# Patient Record
Sex: Male | Born: 1960 | Race: White | Hispanic: No | Marital: Married | Smoking: Never smoker
Health system: Southern US, Community
[De-identification: ages and names within clinical notes are randomized; demographics above are authoritative.]

---

## 2012-07-18 ENCOUNTER — Ambulatory Visit
Admission: RE | Admit: 2012-07-18 | Discharge: 2012-07-18 | Disposition: A | Discharge status: Home / Self Care | Payer: 59 | Source: Ambulatory Visit | Attending: Family Medicine | Admitting: Family Medicine

## 2012-07-18 ENCOUNTER — Other Ambulatory Visit: Payer: Self-pay | Admitting: Family Medicine

## 2012-07-18 DIAGNOSIS — M79605 Pain in left leg: Secondary | ICD-10-CM

## 2016-12-13 ENCOUNTER — Other Ambulatory Visit: Payer: Self-pay | Admitting: Orthopedic Surgery

## 2016-12-13 DIAGNOSIS — G8929 Other chronic pain: Secondary | ICD-10-CM

## 2016-12-13 DIAGNOSIS — M545 Low back pain: Principal | ICD-10-CM

## 2016-12-20 ENCOUNTER — Ambulatory Visit
Admission: RE | Admit: 2016-12-20 | Discharge: 2016-12-20 | Disposition: A | Discharge status: Home / Self Care | Payer: 59 | Source: Ambulatory Visit | Attending: Orthopedic Surgery | Admitting: Orthopedic Surgery

## 2016-12-20 DIAGNOSIS — M545 Low back pain: Principal | ICD-10-CM

## 2016-12-20 DIAGNOSIS — G8929 Other chronic pain: Secondary | ICD-10-CM

## 2016-12-20 MED ORDER — METHYLPREDNISOLONE ACETATE 40 MG/ML INJ SUSP (RADIOLOG
120.0000 mg | Freq: Once | INTRAMUSCULAR | Status: AC
  Administered 2016-12-20: 120 mg via EPIDURAL

## 2016-12-20 MED ORDER — IOPAMIDOL (ISOVUE-M 200) INJECTION 41%
1.0000 mL | Freq: Once | INTRAMUSCULAR | Status: AC
  Administered 2016-12-20: 1 mL via EPIDURAL

## 2016-12-20 NOTE — Discharge Instructions (Signed)

## 2017-01-02 ENCOUNTER — Other Ambulatory Visit: Payer: Self-pay | Admitting: Orthopedic Surgery

## 2017-01-02 DIAGNOSIS — M545 Low back pain: Principal | ICD-10-CM

## 2017-01-02 DIAGNOSIS — G8929 Other chronic pain: Secondary | ICD-10-CM

## 2017-01-11 ENCOUNTER — Other Ambulatory Visit: Payer: Self-pay | Admitting: Orthopedic Surgery

## 2017-01-11 ENCOUNTER — Ambulatory Visit
Admission: RE | Admit: 2017-01-11 | Discharge: 2017-01-11 | Disposition: A | Discharge status: Home / Self Care | Payer: 59 | Source: Ambulatory Visit | Attending: Orthopedic Surgery | Admitting: Orthopedic Surgery

## 2017-01-11 DIAGNOSIS — M545 Low back pain: Principal | ICD-10-CM

## 2017-01-11 DIAGNOSIS — G8929 Other chronic pain: Secondary | ICD-10-CM

## 2017-01-11 MED ORDER — IOPAMIDOL (ISOVUE-M 200) INJECTION 41%
1.0000 mL | Freq: Once | INTRAMUSCULAR | Status: AC
  Administered 2017-01-11: 1 mL via EPIDURAL

## 2017-01-11 MED ORDER — METHYLPREDNISOLONE ACETATE 40 MG/ML INJ SUSP (RADIOLOG
120.0000 mg | Freq: Once | INTRAMUSCULAR | Status: AC
  Administered 2017-01-11: 120 mg via EPIDURAL

## 2017-02-13 ENCOUNTER — Other Ambulatory Visit: Payer: Self-pay | Admitting: Orthopedic Surgery

## 2017-02-13 DIAGNOSIS — M545 Low back pain: Principal | ICD-10-CM

## 2017-02-13 DIAGNOSIS — G8929 Other chronic pain: Secondary | ICD-10-CM

## 2017-02-22 ENCOUNTER — Ambulatory Visit
Admission: RE | Admit: 2017-02-22 | Discharge: 2017-02-22 | Disposition: A | Discharge status: Home / Self Care | Payer: 59 | Source: Ambulatory Visit | Attending: Orthopedic Surgery | Admitting: Orthopedic Surgery

## 2017-02-22 ENCOUNTER — Other Ambulatory Visit: Payer: Self-pay | Admitting: Orthopedic Surgery

## 2017-02-22 DIAGNOSIS — G8929 Other chronic pain: Secondary | ICD-10-CM

## 2017-02-22 DIAGNOSIS — M545 Low back pain: Principal | ICD-10-CM

## 2017-02-22 MED ORDER — METHYLPREDNISOLONE ACETATE 40 MG/ML INJ SUSP (RADIOLOG
120.0000 mg | Freq: Once | INTRAMUSCULAR | Status: AC
  Administered 2017-02-22: 120 mg via EPIDURAL

## 2017-02-22 MED ORDER — IOPAMIDOL (ISOVUE-M 200) INJECTION 41%
1.0000 mL | Freq: Once | INTRAMUSCULAR | Status: AC
  Administered 2017-02-22: 1 mL via EPIDURAL

## 2017-02-22 NOTE — Discharge Instructions (Signed)

## 2017-05-05 ENCOUNTER — Other Ambulatory Visit: Payer: Self-pay | Admitting: Sports Medicine

## 2017-05-05 DIAGNOSIS — M545 Low back pain: Principal | ICD-10-CM

## 2017-05-05 DIAGNOSIS — G8929 Other chronic pain: Secondary | ICD-10-CM

## 2017-06-23 ENCOUNTER — Other Ambulatory Visit: Payer: Self-pay | Admitting: Sports Medicine

## 2017-06-23 ENCOUNTER — Ambulatory Visit
Admission: RE | Admit: 2017-06-23 | Discharge: 2017-06-23 | Disposition: A | Discharge status: Home / Self Care | Payer: 59 | Source: Ambulatory Visit | Attending: Sports Medicine | Admitting: Sports Medicine

## 2017-06-23 DIAGNOSIS — M545 Low back pain: Principal | ICD-10-CM

## 2017-06-23 DIAGNOSIS — G8929 Other chronic pain: Secondary | ICD-10-CM

## 2017-06-23 MED ORDER — METHYLPREDNISOLONE ACETATE 40 MG/ML INJ SUSP (RADIOLOG
120.0000 mg | Freq: Once | INTRAMUSCULAR | Status: AC
  Administered 2017-06-23: 120 mg via EPIDURAL

## 2017-06-23 MED ORDER — IOPAMIDOL (ISOVUE-M 200) INJECTION 41%
1.0000 mL | Freq: Once | INTRAMUSCULAR | Status: AC
  Administered 2017-06-23: 1 mL via EPIDURAL

## 2017-06-23 NOTE — Discharge Instructions (Signed)

## 2017-10-23 DIAGNOSIS — R748 Abnormal levels of other serum enzymes: Secondary | ICD-10-CM | POA: Diagnosis not present

## 2017-10-23 DIAGNOSIS — E7800 Pure hypercholesterolemia, unspecified: Secondary | ICD-10-CM | POA: Diagnosis not present

## 2017-11-09 DIAGNOSIS — M48062 Spinal stenosis, lumbar region with neurogenic claudication: Secondary | ICD-10-CM | POA: Diagnosis not present

## 2017-11-09 DIAGNOSIS — M47896 Other spondylosis, lumbar region: Secondary | ICD-10-CM | POA: Diagnosis not present

## 2017-11-14 ENCOUNTER — Other Ambulatory Visit: Payer: Self-pay | Admitting: Orthopedic Surgery

## 2017-11-14 DIAGNOSIS — M47896 Other spondylosis, lumbar region: Secondary | ICD-10-CM

## 2017-11-23 ENCOUNTER — Ambulatory Visit
Admission: RE | Admit: 2017-11-23 | Discharge: 2017-11-23 | Disposition: A | Discharge status: Home / Self Care | Payer: BLUE CROSS/BLUE SHIELD | Source: Ambulatory Visit | Attending: Orthopedic Surgery | Admitting: Orthopedic Surgery

## 2017-11-23 ENCOUNTER — Inpatient Hospital Stay
Admission: RE | Admit: 2017-11-23 | Discharge: 2017-11-23 | Disposition: A | Discharge status: Home / Self Care | Payer: 59 | Source: Ambulatory Visit | Attending: Orthopedic Surgery | Admitting: Orthopedic Surgery

## 2017-11-23 DIAGNOSIS — M5416 Radiculopathy, lumbar region: Secondary | ICD-10-CM | POA: Diagnosis not present

## 2017-11-23 DIAGNOSIS — M47896 Other spondylosis, lumbar region: Secondary | ICD-10-CM

## 2017-11-23 MED ORDER — IOPAMIDOL (ISOVUE-M 200) INJECTION 41%
1.0000 mL | Freq: Once | INTRAMUSCULAR | Status: AC
  Administered 2017-11-23: 1 mL via EPIDURAL

## 2017-11-23 MED ORDER — METHYLPREDNISOLONE ACETATE 40 MG/ML INJ SUSP (RADIOLOG
120.0000 mg | Freq: Once | INTRAMUSCULAR | Status: AC
  Administered 2017-11-23: 120 mg via EPIDURAL

## 2017-12-15 DIAGNOSIS — M4726 Other spondylosis with radiculopathy, lumbar region: Secondary | ICD-10-CM | POA: Diagnosis not present

## 2017-12-15 DIAGNOSIS — M5432 Sciatica, left side: Secondary | ICD-10-CM | POA: Diagnosis not present

## 2018-04-24 DIAGNOSIS — E7800 Pure hypercholesterolemia, unspecified: Secondary | ICD-10-CM | POA: Diagnosis not present

## 2018-04-24 DIAGNOSIS — Z0000 Encounter for general adult medical examination without abnormal findings: Secondary | ICD-10-CM | POA: Diagnosis not present

## 2018-05-01 DIAGNOSIS — Z0000 Encounter for general adult medical examination without abnormal findings: Secondary | ICD-10-CM | POA: Diagnosis not present

## 2018-11-29 DIAGNOSIS — M48062 Spinal stenosis, lumbar region with neurogenic claudication: Secondary | ICD-10-CM | POA: Diagnosis not present

## 2018-11-29 DIAGNOSIS — M5432 Sciatica, left side: Secondary | ICD-10-CM | POA: Diagnosis not present

## 2018-12-13 DIAGNOSIS — L565 Disseminated superficial actinic porokeratosis (DSAP): Secondary | ICD-10-CM | POA: Diagnosis not present

## 2018-12-13 DIAGNOSIS — M79672 Pain in left foot: Secondary | ICD-10-CM | POA: Diagnosis not present

## 2018-12-13 DIAGNOSIS — L602 Onychogryphosis: Secondary | ICD-10-CM | POA: Diagnosis not present

## 2018-12-13 DIAGNOSIS — M79671 Pain in right foot: Secondary | ICD-10-CM | POA: Diagnosis not present

## 2019-05-20 DIAGNOSIS — Z0000 Encounter for general adult medical examination without abnormal findings: Secondary | ICD-10-CM | POA: Diagnosis not present

## 2019-05-20 DIAGNOSIS — Z125 Encounter for screening for malignant neoplasm of prostate: Secondary | ICD-10-CM | POA: Diagnosis not present

## 2019-05-20 DIAGNOSIS — E7800 Pure hypercholesterolemia, unspecified: Secondary | ICD-10-CM | POA: Diagnosis not present

## 2019-05-22 DIAGNOSIS — I10 Essential (primary) hypertension: Secondary | ICD-10-CM | POA: Diagnosis not present

## 2019-05-22 DIAGNOSIS — E559 Vitamin D deficiency, unspecified: Secondary | ICD-10-CM | POA: Diagnosis not present

## 2019-05-22 DIAGNOSIS — Z0000 Encounter for general adult medical examination without abnormal findings: Secondary | ICD-10-CM | POA: Diagnosis not present

## 2019-11-12 DIAGNOSIS — M4726 Other spondylosis with radiculopathy, lumbar region: Secondary | ICD-10-CM | POA: Diagnosis not present

## 2019-11-12 DIAGNOSIS — M5432 Sciatica, left side: Secondary | ICD-10-CM | POA: Diagnosis not present

## 2019-11-12 DIAGNOSIS — M48062 Spinal stenosis, lumbar region with neurogenic claudication: Secondary | ICD-10-CM | POA: Diagnosis not present

## 2020-01-01 DIAGNOSIS — M25512 Pain in left shoulder: Secondary | ICD-10-CM | POA: Diagnosis not present

## 2020-01-16 DIAGNOSIS — M5412 Radiculopathy, cervical region: Secondary | ICD-10-CM | POA: Diagnosis not present

## 2020-01-16 DIAGNOSIS — M4722 Other spondylosis with radiculopathy, cervical region: Secondary | ICD-10-CM | POA: Diagnosis not present

## 2020-01-20 DIAGNOSIS — M542 Cervicalgia: Secondary | ICD-10-CM | POA: Diagnosis not present

## 2020-01-20 DIAGNOSIS — M25512 Pain in left shoulder: Secondary | ICD-10-CM | POA: Diagnosis not present

## 2020-01-20 DIAGNOSIS — R6889 Other general symptoms and signs: Secondary | ICD-10-CM | POA: Diagnosis not present

## 2020-01-28 ENCOUNTER — Other Ambulatory Visit: Payer: Self-pay | Admitting: Orthopedic Surgery

## 2020-01-28 DIAGNOSIS — M5412 Radiculopathy, cervical region: Secondary | ICD-10-CM

## 2020-02-09 ENCOUNTER — Other Ambulatory Visit: Payer: Self-pay

## 2020-02-09 ENCOUNTER — Ambulatory Visit
Admission: RE | Admit: 2020-02-09 | Discharge: 2020-02-09 | Disposition: A | Discharge status: Home / Self Care | Payer: BLUE CROSS/BLUE SHIELD | Source: Ambulatory Visit | Attending: Orthopedic Surgery | Admitting: Orthopedic Surgery

## 2020-02-09 DIAGNOSIS — M5412 Radiculopathy, cervical region: Secondary | ICD-10-CM

## 2020-02-09 DIAGNOSIS — M4802 Spinal stenosis, cervical region: Secondary | ICD-10-CM | POA: Diagnosis not present

## 2020-02-11 DIAGNOSIS — M5412 Radiculopathy, cervical region: Secondary | ICD-10-CM | POA: Diagnosis not present

## 2020-02-11 DIAGNOSIS — M4722 Other spondylosis with radiculopathy, cervical region: Secondary | ICD-10-CM | POA: Diagnosis not present

## 2020-02-28 DIAGNOSIS — M5412 Radiculopathy, cervical region: Secondary | ICD-10-CM | POA: Diagnosis not present

## 2020-02-28 DIAGNOSIS — M4722 Other spondylosis with radiculopathy, cervical region: Secondary | ICD-10-CM | POA: Diagnosis not present

## 2020-05-25 DIAGNOSIS — Z125 Encounter for screening for malignant neoplasm of prostate: Secondary | ICD-10-CM | POA: Diagnosis not present

## 2020-05-25 DIAGNOSIS — E559 Vitamin D deficiency, unspecified: Secondary | ICD-10-CM | POA: Diagnosis not present

## 2020-05-25 DIAGNOSIS — Z23 Encounter for immunization: Secondary | ICD-10-CM | POA: Diagnosis not present

## 2020-05-25 DIAGNOSIS — Z0000 Encounter for general adult medical examination without abnormal findings: Secondary | ICD-10-CM | POA: Diagnosis not present

## 2020-05-25 DIAGNOSIS — Z13220 Encounter for screening for lipoid disorders: Secondary | ICD-10-CM | POA: Diagnosis not present

## 2020-08-26 DIAGNOSIS — Z23 Encounter for immunization: Secondary | ICD-10-CM | POA: Diagnosis not present

## 2020-10-15 DIAGNOSIS — M5116 Intervertebral disc disorders with radiculopathy, lumbar region: Secondary | ICD-10-CM | POA: Diagnosis not present

## 2020-10-15 DIAGNOSIS — M5412 Radiculopathy, cervical region: Secondary | ICD-10-CM | POA: Diagnosis not present

## 2020-10-15 DIAGNOSIS — M545 Low back pain, unspecified: Secondary | ICD-10-CM | POA: Diagnosis not present

## 2020-10-15 DIAGNOSIS — M4726 Other spondylosis with radiculopathy, lumbar region: Secondary | ICD-10-CM | POA: Diagnosis not present

## 2020-11-02 DIAGNOSIS — L02213 Cutaneous abscess of chest wall: Secondary | ICD-10-CM | POA: Diagnosis not present

## 2020-11-10 DIAGNOSIS — L02213 Cutaneous abscess of chest wall: Secondary | ICD-10-CM | POA: Diagnosis not present

## 2020-11-16 ENCOUNTER — Encounter: Payer: Self-pay | Admitting: Dermatology

## 2020-11-16 ENCOUNTER — Ambulatory Visit: Payer: BC Managed Care – PPO | Admitting: Dermatology

## 2020-11-16 ENCOUNTER — Other Ambulatory Visit: Payer: Self-pay

## 2020-11-16 DIAGNOSIS — Z1283 Encounter for screening for malignant neoplasm of skin: Secondary | ICD-10-CM

## 2020-11-16 DIAGNOSIS — L918 Other hypertrophic disorders of the skin: Secondary | ICD-10-CM | POA: Diagnosis not present

## 2020-11-16 DIAGNOSIS — L729 Follicular cyst of the skin and subcutaneous tissue, unspecified: Secondary | ICD-10-CM | POA: Diagnosis not present

## 2020-11-16 MED ORDER — MINOCYCLINE HCL 50 MG PO CAPS: 50.0000 mg | ORAL_CAPSULE | Freq: Two times a day (BID) | ORAL | 0 refills | Status: DC

## 2020-11-16 NOTE — Patient Instructions (Signed)
Complete all antibiotics before starting the minocycline before surgery.

## 2020-11-17 DIAGNOSIS — L02213 Cutaneous abscess of chest wall: Secondary | ICD-10-CM | POA: Diagnosis not present

## 2020-11-17 DIAGNOSIS — I10 Essential (primary) hypertension: Secondary | ICD-10-CM | POA: Diagnosis not present

## 2020-11-26 DIAGNOSIS — M5116 Intervertebral disc disorders with radiculopathy, lumbar region: Secondary | ICD-10-CM | POA: Diagnosis not present

## 2020-11-26 DIAGNOSIS — M4726 Other spondylosis with radiculopathy, lumbar region: Secondary | ICD-10-CM | POA: Diagnosis not present

## 2020-11-26 DIAGNOSIS — M5412 Radiculopathy, cervical region: Secondary | ICD-10-CM | POA: Diagnosis not present

## 2020-11-30 ENCOUNTER — Encounter: Payer: Self-pay | Admitting: Dermatology

## 2020-11-30 NOTE — Progress Notes (Signed)
   New Patient   Subjective  Jonathan Anderson is a 60 y.o. male who presents for the following: New Patient (Initial Visit) (Patient here today for cyst on his chest x years that was inflamed 2.5 weeks ago. Per patient his PCP put him on Doxy, and a week later he was put on Augmentin and I&D was done at that time. Per patient his PCP also did a culture. ).  Of cyst on left chest, recently inflamed now improved.  Also several other areas to check. Location:  Duration:  Quality:  Associated Signs/Symptoms: Modifying Factors:  Severity:  Timing: Context:    The following portions of the chart were reviewed this encounter and updated as appropriate:  Tobacco  Allergies  Meds  Problems  Med Hx  Surg Hx  Fam Hx      Objective  Well appearing patient in no apparent distress; mood and affect are within normal limits. Left Breast Patient here today for cyst on his chest x years that was inflamed 2.5 weeks ago. Per patient his PCP put him on Doxy, and a week later he was put on Augmentin and I&D was done at that time. Per patient his PCP also did a culture.  Today there is mild residual erythema and slight edema, no fluctuance, probable early fibrosis and cyst margins indistinct   Scalp Full body skin check  groin tag, many keratoses, cyst of the right chest, left post lower leg DF/CYST    All skin waist up examined.   Assessment & Plan  Cyst of skin Left Breast  I explained to Jonathan Anderson that since are benign but somewhat prone to inflammation, and once there is been inflammation it makes future removal a bit more challenging.  Mcn 50 mg bid to start before surgery if patient wants removal plus 1 hour surgery 2 cm. Also skin tag in the groin 15 minute surgery 275$ waiver   Screening exam for skin cancer Scalp

## 2020-12-15 ENCOUNTER — Other Ambulatory Visit: Payer: Self-pay | Admitting: Dermatology

## 2021-01-13 ENCOUNTER — Other Ambulatory Visit: Payer: Self-pay | Admitting: Dermatology

## 2021-01-14 ENCOUNTER — Encounter: Payer: Self-pay | Admitting: Dermatology

## 2021-01-14 ENCOUNTER — Other Ambulatory Visit: Payer: Self-pay

## 2021-01-14 ENCOUNTER — Ambulatory Visit (INDEPENDENT_AMBULATORY_CARE_PROVIDER_SITE_OTHER): Payer: BC Managed Care – PPO | Admitting: Dermatology

## 2021-01-14 DIAGNOSIS — L720 Epidermal cyst: Secondary | ICD-10-CM | POA: Diagnosis not present

## 2021-01-14 NOTE — Patient Instructions (Signed)

## 2021-01-27 ENCOUNTER — Ambulatory Visit (INDEPENDENT_AMBULATORY_CARE_PROVIDER_SITE_OTHER): Payer: BC Managed Care – PPO | Admitting: *Deleted

## 2021-01-27 ENCOUNTER — Other Ambulatory Visit: Payer: Self-pay

## 2021-01-27 DIAGNOSIS — Z4802 Encounter for removal of sutures: Secondary | ICD-10-CM

## 2021-01-27 NOTE — Progress Notes (Signed)
Here for suture removal x 5- no sign or symptom of infection- pathology to patient.

## 2021-02-09 ENCOUNTER — Encounter: Payer: Self-pay | Admitting: Dermatology

## 2021-02-09 NOTE — Progress Notes (Signed)
° °  Follow-Up Visit   Subjective  Jonathan Anderson is a 61 y.o. male who presents for the following: Cyst (Pt here for cyst removal on the Left Breast ).  Cyst left chest which patient wants removed Location:  Duration:  Quality:  Associated Signs/Symptoms: Modifying Factors:  Severity:  Timing: Context:   Objective  Well appearing patient in no apparent distress; mood and affect are within normal limits. Cyst identified by patient, he requests removal.  Although not acutely inflamed there is fibrosis with ill-defined margins.  Told preoperatively of risk of local recurrence, unsightly scar, noncoverage by health insurance.  He requested we proceed.  Left Breast Vicryl 3-0 x 3 Ethilon 3-0 x4    All skin waist up examined.   Assessment & Plan    Epidermoid cyst Left Breast  Tx options have been discussed. Pt did a course of minocycline.   Skin excision - Left Breast  Lesion length (cm):  3.2 Lesion width (cm):  3.2 Margin per side (cm):  0 Total excision diameter (cm):  3.2 Informed consent: discussed and consent obtained   Timeout: patient name, date of birth, surgical site, and procedure verified   Anesthesia: the lesion was anesthetized in a standard fashion   Anesthetic:  1% lidocaine w/ epinephrine 1-100,000 local infiltration Instrument used: #15 blade   Hemostasis achieved with: pressure and electrodesiccation   Outcome: patient tolerated procedure well with no complications   Post-procedure details: sterile dressing applied and wound care instructions given   Dressing type: bandage, petrolatum and pressure dressing    Specimen 1 - Surgical pathology Differential Diagnosis: R/O CYST  Check Margins: No      I, Lavonna Monarch, MD, have reviewed all documentation for this visit.  The documentation on 02/09/21 for the exam, diagnosis, procedures, and orders are all accurate and complete.

## 2021-02-22 ENCOUNTER — Other Ambulatory Visit: Payer: Self-pay

## 2021-02-22 ENCOUNTER — Encounter: Payer: Self-pay | Admitting: Dermatology

## 2021-02-22 ENCOUNTER — Ambulatory Visit (INDEPENDENT_AMBULATORY_CARE_PROVIDER_SITE_OTHER): Payer: BC Managed Care – PPO | Admitting: Dermatology

## 2021-02-22 DIAGNOSIS — L918 Other hypertrophic disorders of the skin: Secondary | ICD-10-CM

## 2021-02-22 DIAGNOSIS — D485 Neoplasm of uncertain behavior of skin: Secondary | ICD-10-CM | POA: Diagnosis not present

## 2021-02-22 DIAGNOSIS — L919 Hypertrophic disorder of the skin, unspecified: Secondary | ICD-10-CM | POA: Diagnosis not present

## 2021-03-13 ENCOUNTER — Encounter: Payer: Self-pay | Admitting: Dermatology

## 2021-03-13 NOTE — Progress Notes (Signed)
°  Jonathan Anderson is a 61 y.o. male who presents for the following: Procedure (Skin tag removal. ).  Growth  Follow-Up Visit   Subjective  Jonathan Anderson is a 61 y.o. male who presents for the following: Procedure (Skin tag removal. ).  Growth on right leg, discussed skin tag removals Location:  Duration:  Quality:  Associated Signs/Symptoms: Modifying Factors:  Severity:  Timing: Context:   Objective  Well appearing patient in no apparent distress; mood and affect are within normal limits. Right Medial Thigh 5 mm pedunculated papule prone to recurrent irritation.  Fibroepithelial polyp versus superficial lipoma versus neurofibroma.       Left Upper Arm - Anterior, Neck - Anterior Multiple small pedunculated flesh-colored papules    All skin waist up examined.   Assessment & Plan    Neoplasm of uncertain behavior of skin Right Medial Thigh  Skin / nail biopsy Type of biopsy: tangential   Informed consent: discussed and consent obtained   Timeout: patient name, date of birth, surgical site, and procedure verified   Anesthesia: the lesion was anesthetized in a standard fashion   Anesthetic:  1% lidocaine w/ epinephrine 1-100,000 local infiltration Instrument used: flexible razor blade   Hemostasis achieved with: aluminum chloride and electrodesiccation   Outcome: patient tolerated procedure well   Post-procedure details: wound care instructions given    Specimen 1 - Surgical pathology Differential Diagnosis: r/o nevus  Check Margins: No  Skin tags, multiple acquired (2) Neck - Anterior; Left Upper Arm - Anterior  May choose to remove in future.      I, Lavonna Monarch, MD, have reviewed all documentation for this visit.  The documentation on 03/13/21 for the exam, diagnosis, procedures, and orders are all accurate and complete. on right leg, discussed skin tag removal Location:  Duration:  Quality:  Associated Signs/Symptoms: Modifying Factors:   Severity:  Timing: Context:   Objective  Well appearing patient in no apparent distress; mood and affect are within normal limits. Right Medial Thigh 5 mm pedunculated papule prone to recurrent irritation.  Fibroepithelial polyp versus superficial lipoma versus neurofibroma.       Left Upper Arm - Anterior, Neck - Anterior Multiple small pedunculated flesh-colored papules       Assessment & Plan    Neoplasm of uncertain behavior of skin Right Medial Thigh  Skin / nail biopsy Type of biopsy: tangential   Informed consent: discussed and consent obtained   Timeout: patient name, date of birth, surgical site, and procedure verified   Anesthesia: the lesion was anesthetized in a standard fashion   Anesthetic:  1% lidocaine w/ epinephrine 1-100,000 local infiltration Instrument used: flexible razor blade   Hemostasis achieved with: aluminum chloride and electrodesiccation   Outcome: patient tolerated procedure well   Post-procedure details: wound care instructions given    Specimen 1 - Surgical pathology Differential Diagnosis: r/o nevus  Check Margins: No  Skin tags, multiple acquired (2) Neck - Anterior; Left Upper Arm - Anterior  May choose to remove in future.      I, Lavonna Monarch, MD, have reviewed all documentation for this visit.  The documentation on 03/13/21 for the exam, diagnosis, procedures, and orders are all accurate and complete.

## 2021-05-26 DIAGNOSIS — Z125 Encounter for screening for malignant neoplasm of prostate: Secondary | ICD-10-CM | POA: Diagnosis not present

## 2021-05-26 DIAGNOSIS — Z0000 Encounter for general adult medical examination without abnormal findings: Secondary | ICD-10-CM | POA: Diagnosis not present

## 2021-05-26 DIAGNOSIS — E559 Vitamin D deficiency, unspecified: Secondary | ICD-10-CM | POA: Diagnosis not present

## 2021-05-26 DIAGNOSIS — Z13220 Encounter for screening for lipoid disorders: Secondary | ICD-10-CM | POA: Diagnosis not present

## 2021-05-27 DIAGNOSIS — M5116 Intervertebral disc disorders with radiculopathy, lumbar region: Secondary | ICD-10-CM | POA: Diagnosis not present

## 2021-05-27 DIAGNOSIS — M5412 Radiculopathy, cervical region: Secondary | ICD-10-CM | POA: Diagnosis not present

## 2021-06-02 DIAGNOSIS — I10 Essential (primary) hypertension: Secondary | ICD-10-CM | POA: Diagnosis not present

## 2021-06-02 DIAGNOSIS — M545 Low back pain, unspecified: Secondary | ICD-10-CM | POA: Diagnosis not present

## 2021-06-02 DIAGNOSIS — Z0000 Encounter for general adult medical examination without abnormal findings: Secondary | ICD-10-CM | POA: Diagnosis not present

## 2021-06-02 DIAGNOSIS — Z23 Encounter for immunization: Secondary | ICD-10-CM | POA: Diagnosis not present

## 2021-06-02 DIAGNOSIS — D485 Neoplasm of uncertain behavior of skin: Secondary | ICD-10-CM | POA: Diagnosis not present

## 2021-06-17 DIAGNOSIS — J019 Acute sinusitis, unspecified: Secondary | ICD-10-CM | POA: Diagnosis not present

## 2021-06-27 IMAGING — MR MR CERVICAL SPINE W/O CM
5 series · 33 of 48 positions shown · non-contrast
Comparison: None available.

CLINICAL DATA: Initial evaluation for cervical radiculopathy, left
upper extremity tingling for 4-5 weeks.

EXAM:
MRI CERVICAL SPINE WITHOUT CONTRAST
TECHNIQUE: Multiplanar, multisequence MR imaging of the cervical spine was
performed. No intravenous contrast was administered.

[Series 4: T2 · sagittal · 3.0mm · 0.41mm/px · 6 of 17 slices shown (1 of 2)]
[im 1/17]
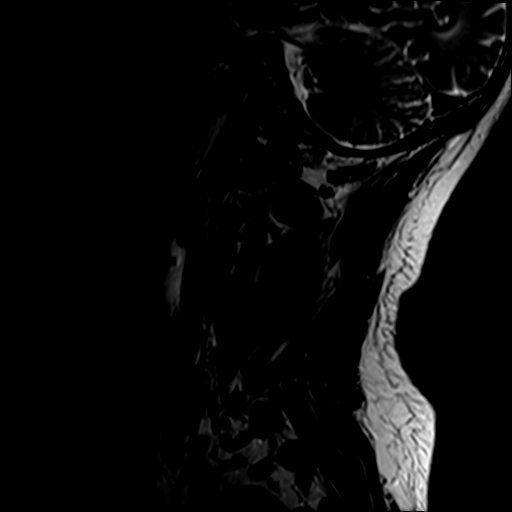
[im 4/17]
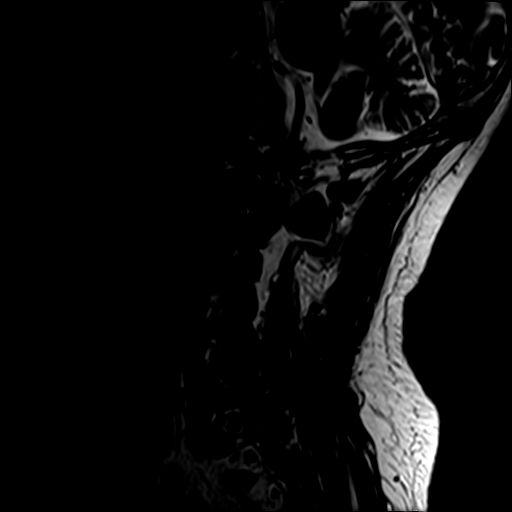
[im 7/17]
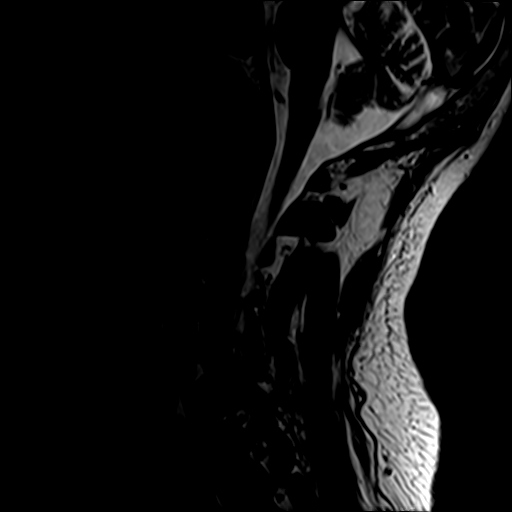
[im 10/17]
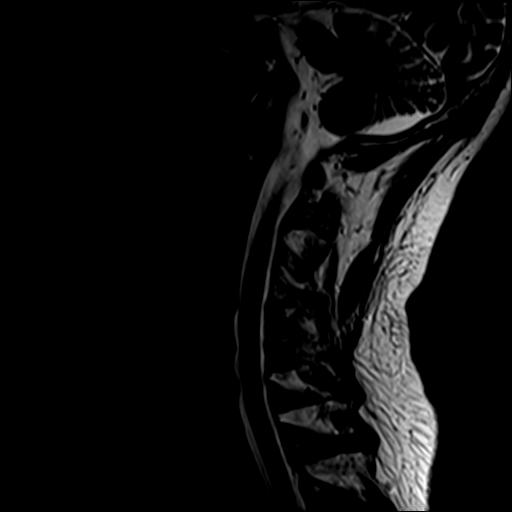
[im 13/17]
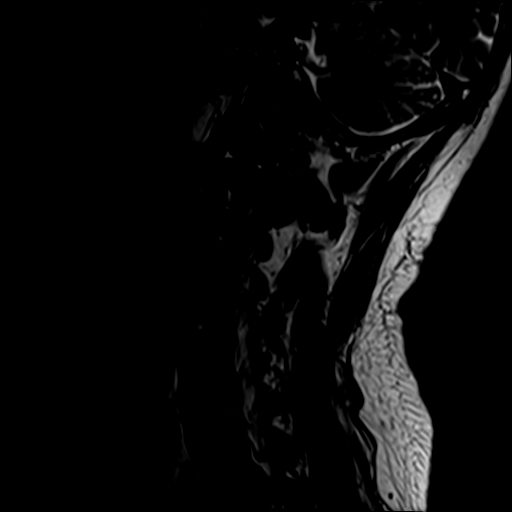
[im 17/17]
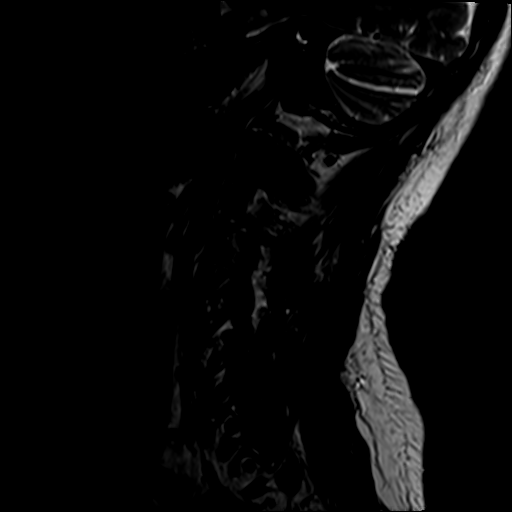

[Series 5: STIR · sagittal · 3.0mm · 0.82mm/px · 7 of 17 slices shown]
[im 1/17]
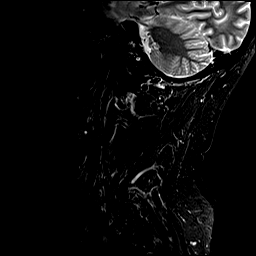
[im 3/17]
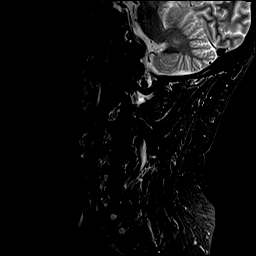
[im 6/17]
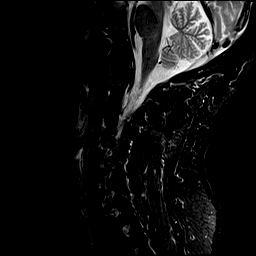
[im 9/17]
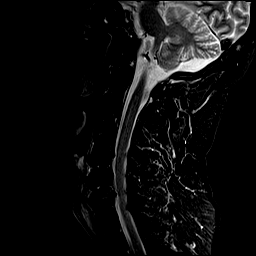
[im 11/17]
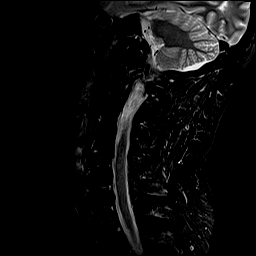
[im 14/17]
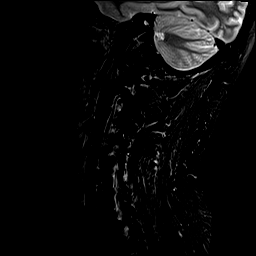
[im 17/17]
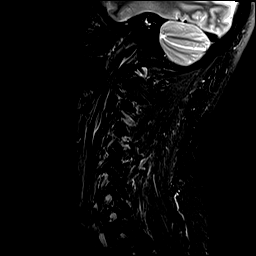

[Series 6: T1 · sagittal · 3.0mm · 0.82mm/px · 7 of 17 slices shown]
[im 1/17]
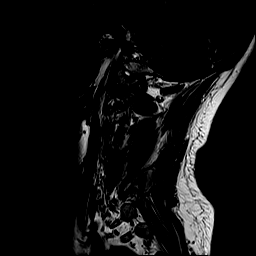
[im 3/17]
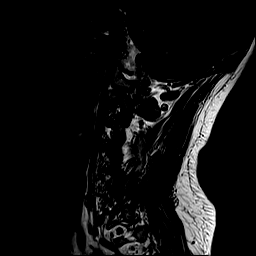
[im 6/17]
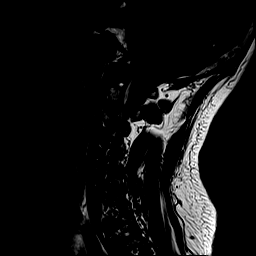
[im 9/17]
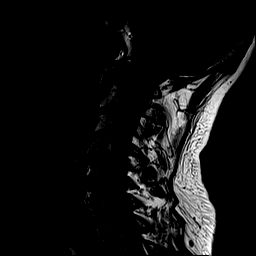
[im 11/17]
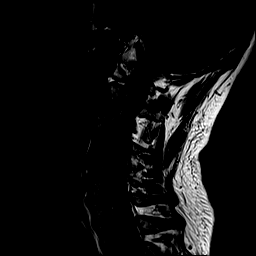
[im 14/17]
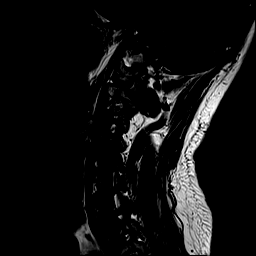
[im 17/17]
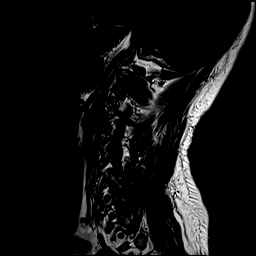

[Series 7: T2 · axial · 3.0mm · 0.70mm/px · z∈[-124,-3]mm · 8 of 34 slices shown (2 of 2)]
[im 1/34]
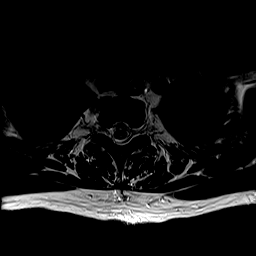
[im 6/34]
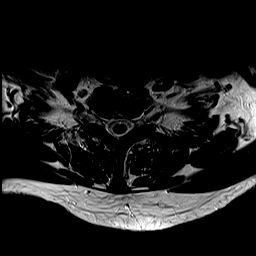
[im 11/34]
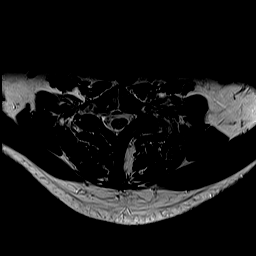
[im 16/34]
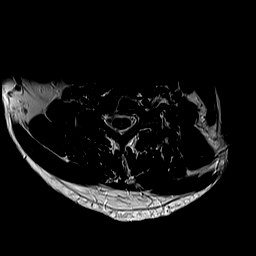
[im 18/34]
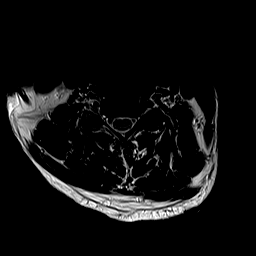
[im 23/34]
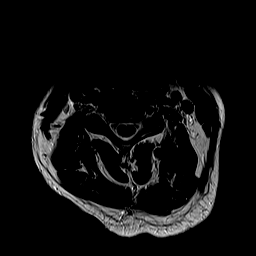
[im 28/34]
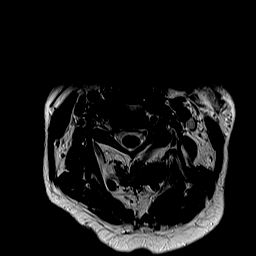
[im 34/34]
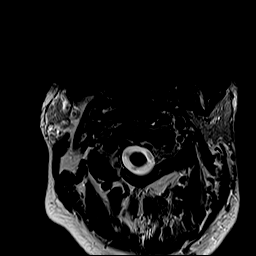

[Series 8: GRE · axial · 3.0mm · 0.35mm/px · z∈[-122,-60]mm · 5 of 34 slices shown]
[im 1/34]
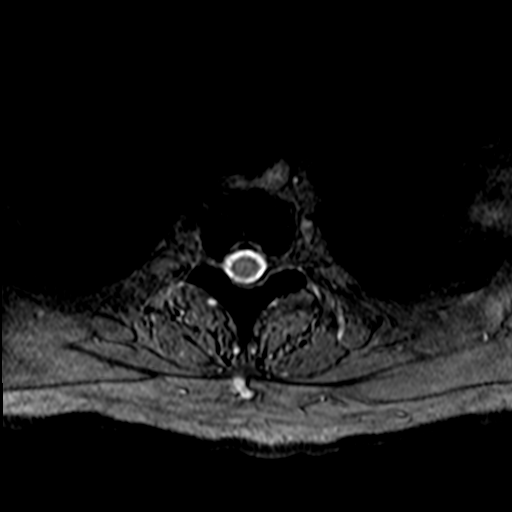
[im 6/34]
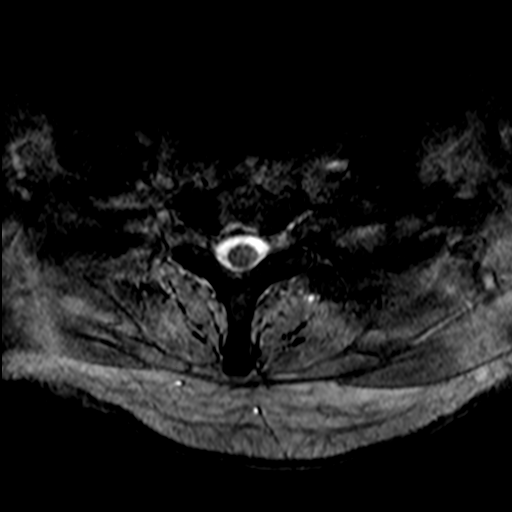
[im 11/34]
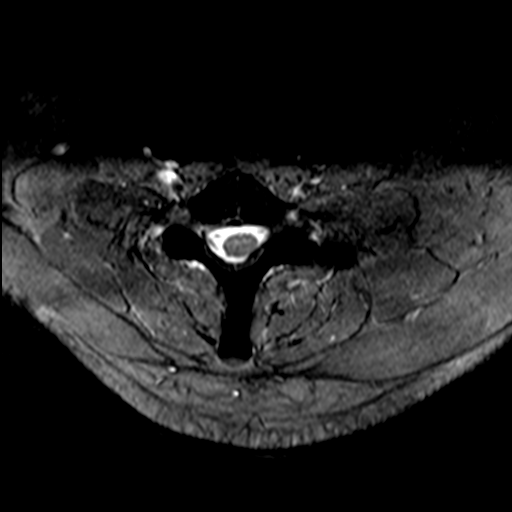
[im 16/34]
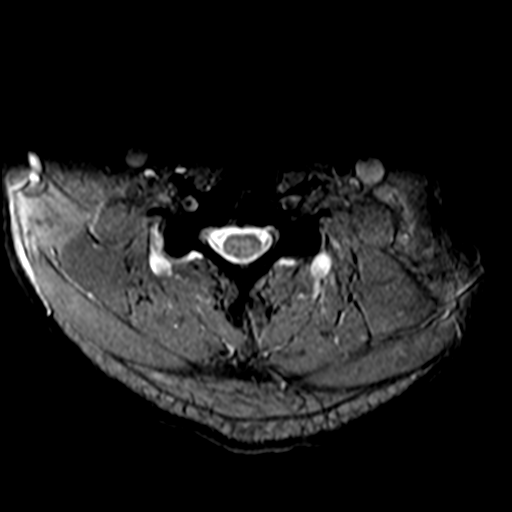
[im 18/34]
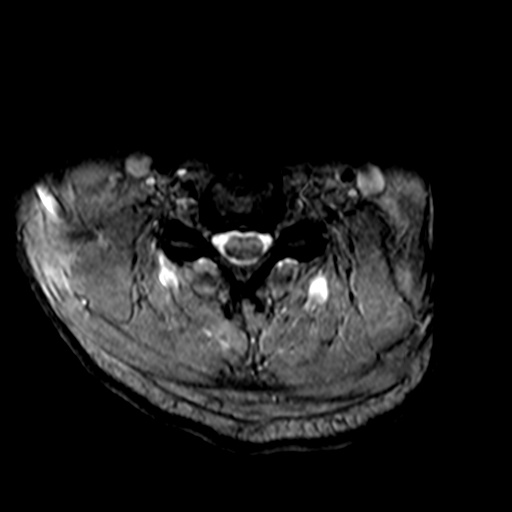

[33 of 48 positions shown; findings below may reference images not displayed]

FINDINGS: Alignment: Vertebral bodies normally aligned with preservation of
the normal cervical lordosis. Trace scoliosis. No significant
listhesis.

Vertebrae: Vertebral body height maintained without acute or chronic
fracture. Bone marrow signal intensity within normal limits. No
worrisome osseous lesions. No abnormal marrow edema.

Cord: Normal signal and morphology.

Posterior Fossa, vertebral arteries, paraspinal tissues: Visualized
brain and posterior fossa within normal limits. Craniocervical
junction normal. Paraspinous and prevertebral soft tissues within
normal limits. Normal flow voids seen within the vertebral arteries
bilaterally.

Disc levels:

C2-C3: Unremarkable.

C3-C4: Normal interspace. Left-sided facet hypertrophy. No
significant spinal stenosis. Foramina remain patent.

C4-C5: Mild disc bulge with uncovertebral hypertrophy. Mild
left-sided facet and ligament flavum hypertrophy. No significant
spinal stenosis. Mild bilateral C5 foraminal stenosis.

C5-C6: Mild disc bulge. Left greater than right uncovertebral
hypertrophy. Mild left-sided facet degeneration. No significant
spinal stenosis. Moderate left worse than right C6 foraminal
narrowing.

C6-C7: Degenerative intervertebral disc space narrowing with diffuse
disc bulge. Bilateral uncovertebral hypertrophy, greater on the
left. Superimposed mild left-sided facet degeneration. Flattening of
the ventral thecal sac without significant spinal stenosis. Moderate
to severe left with moderate right C7 foraminal stenosis.

C7-T1: Disc desiccation without disc bulge. Mild facet hypertrophy.
No canal or foraminal stenosis.

Visualized upper thoracic spine demonstrates no significant finding.
IMPRESSION: 1. Multilevel cervical spondylosis without significant spinal
stenosis.
2. Multifactorial degenerative changes with resultant multilevel
foraminal narrowing as above. Notable findings include moderate left
worse than right C6 foraminal stenosis, with moderate to severe left
and moderate right C7 foraminal narrowing.

## 2021-07-22 DIAGNOSIS — M5116 Intervertebral disc disorders with radiculopathy, lumbar region: Secondary | ICD-10-CM | POA: Diagnosis not present

## 2021-07-22 DIAGNOSIS — M5412 Radiculopathy, cervical region: Secondary | ICD-10-CM | POA: Diagnosis not present

## 2021-07-30 DIAGNOSIS — M47816 Spondylosis without myelopathy or radiculopathy, lumbar region: Secondary | ICD-10-CM | POA: Diagnosis not present

## 2021-07-30 DIAGNOSIS — M5137 Other intervertebral disc degeneration, lumbosacral region: Secondary | ICD-10-CM | POA: Diagnosis not present

## 2021-07-30 DIAGNOSIS — M47817 Spondylosis without myelopathy or radiculopathy, lumbosacral region: Secondary | ICD-10-CM | POA: Diagnosis not present

## 2021-07-30 DIAGNOSIS — M4316 Spondylolisthesis, lumbar region: Secondary | ICD-10-CM | POA: Diagnosis not present

## 2021-07-30 DIAGNOSIS — M5116 Intervertebral disc disorders with radiculopathy, lumbar region: Secondary | ICD-10-CM | POA: Diagnosis not present

## 2021-08-19 DIAGNOSIS — M5116 Intervertebral disc disorders with radiculopathy, lumbar region: Secondary | ICD-10-CM | POA: Diagnosis not present

## 2021-08-19 DIAGNOSIS — M4726 Other spondylosis with radiculopathy, lumbar region: Secondary | ICD-10-CM | POA: Diagnosis not present

## 2021-09-07 DIAGNOSIS — M5116 Intervertebral disc disorders with radiculopathy, lumbar region: Secondary | ICD-10-CM | POA: Diagnosis not present

## 2021-10-05 DIAGNOSIS — M4726 Other spondylosis with radiculopathy, lumbar region: Secondary | ICD-10-CM | POA: Diagnosis not present

## 2021-10-06 ENCOUNTER — Other Ambulatory Visit: Payer: Self-pay | Admitting: *Deleted

## 2021-10-06 DIAGNOSIS — M5116 Intervertebral disc disorders with radiculopathy, lumbar region: Secondary | ICD-10-CM

## 2021-10-11 ENCOUNTER — Ambulatory Visit
Admission: RE | Admit: 2021-10-11 | Discharge: 2021-10-11 | Disposition: A | Discharge status: Home / Self Care | Payer: BC Managed Care – PPO | Source: Ambulatory Visit | Attending: *Deleted | Admitting: *Deleted

## 2021-10-11 DIAGNOSIS — M5116 Intervertebral disc disorders with radiculopathy, lumbar region: Secondary | ICD-10-CM

## 2021-10-11 DIAGNOSIS — M5416 Radiculopathy, lumbar region: Secondary | ICD-10-CM | POA: Diagnosis not present

## 2021-10-11 MED ORDER — IOPAMIDOL (ISOVUE-M 200) INJECTION 41%
1.0000 mL | Freq: Once | INTRAMUSCULAR | Status: AC
  Administered 2021-10-11: 1 mL via EPIDURAL

## 2021-10-11 MED ORDER — METHYLPREDNISOLONE ACETATE 40 MG/ML INJ SUSP (RADIOLOG
80.0000 mg | Freq: Once | INTRAMUSCULAR | Status: AC
  Administered 2021-10-11: 80 mg via EPIDURAL

## 2021-10-11 NOTE — Discharge Instructions (Signed)

## 2021-10-28 DIAGNOSIS — M5116 Intervertebral disc disorders with radiculopathy, lumbar region: Secondary | ICD-10-CM | POA: Diagnosis not present

## 2021-11-12 ENCOUNTER — Other Ambulatory Visit (HOSPITAL_BASED_OUTPATIENT_CLINIC_OR_DEPARTMENT_OTHER): Payer: Self-pay

## 2021-11-12 MED ORDER — COMIRNATY 30 MCG/0.3ML IM SUSP
INTRAMUSCULAR | 0 refills | Status: AC
  Filled 2021-11-12: qty 0.3, 1d supply, fill #0

## 2021-11-25 DIAGNOSIS — M7918 Myalgia, other site: Secondary | ICD-10-CM | POA: Diagnosis not present

## 2021-11-25 DIAGNOSIS — M4726 Other spondylosis with radiculopathy, lumbar region: Secondary | ICD-10-CM | POA: Diagnosis not present

## 2022-01-06 DIAGNOSIS — M4726 Other spondylosis with radiculopathy, lumbar region: Secondary | ICD-10-CM | POA: Diagnosis not present

## 2022-01-06 DIAGNOSIS — M5116 Intervertebral disc disorders with radiculopathy, lumbar region: Secondary | ICD-10-CM | POA: Diagnosis not present

## 2022-01-06 DIAGNOSIS — M7918 Myalgia, other site: Secondary | ICD-10-CM | POA: Diagnosis not present

## 2022-02-07 DIAGNOSIS — K648 Other hemorrhoids: Secondary | ICD-10-CM | POA: Diagnosis not present

## 2022-02-07 DIAGNOSIS — D127 Benign neoplasm of rectosigmoid junction: Secondary | ICD-10-CM | POA: Diagnosis not present

## 2022-02-07 DIAGNOSIS — Z86010 Personal history of colonic polyps: Secondary | ICD-10-CM | POA: Diagnosis not present

## 2022-02-07 DIAGNOSIS — Z09 Encounter for follow-up examination after completed treatment for conditions other than malignant neoplasm: Secondary | ICD-10-CM | POA: Diagnosis not present

## 2022-02-07 DIAGNOSIS — D125 Benign neoplasm of sigmoid colon: Secondary | ICD-10-CM | POA: Diagnosis not present

## 2022-02-07 DIAGNOSIS — D120 Benign neoplasm of cecum: Secondary | ICD-10-CM | POA: Diagnosis not present

## 2022-03-14 DIAGNOSIS — L728 Other follicular cysts of the skin and subcutaneous tissue: Secondary | ICD-10-CM | POA: Diagnosis not present

## 2022-03-14 DIAGNOSIS — D485 Neoplasm of uncertain behavior of skin: Secondary | ICD-10-CM | POA: Diagnosis not present

## 2022-03-14 DIAGNOSIS — L814 Other melanin hyperpigmentation: Secondary | ICD-10-CM | POA: Diagnosis not present

## 2022-03-14 DIAGNOSIS — L281 Prurigo nodularis: Secondary | ICD-10-CM | POA: Diagnosis not present

## 2022-03-14 DIAGNOSIS — L821 Other seborrheic keratosis: Secondary | ICD-10-CM | POA: Diagnosis not present

## 2022-06-02 DIAGNOSIS — Z13220 Encounter for screening for lipoid disorders: Secondary | ICD-10-CM | POA: Diagnosis not present

## 2022-06-02 DIAGNOSIS — E559 Vitamin D deficiency, unspecified: Secondary | ICD-10-CM | POA: Diagnosis not present

## 2022-06-02 DIAGNOSIS — Z0000 Encounter for general adult medical examination without abnormal findings: Secondary | ICD-10-CM | POA: Diagnosis not present

## 2022-06-02 DIAGNOSIS — Z125 Encounter for screening for malignant neoplasm of prostate: Secondary | ICD-10-CM | POA: Diagnosis not present

## 2022-06-08 ENCOUNTER — Other Ambulatory Visit (HOSPITAL_COMMUNITY): Payer: Self-pay | Admitting: Family Medicine

## 2022-06-08 DIAGNOSIS — R490 Dysphonia: Secondary | ICD-10-CM | POA: Diagnosis not present

## 2022-06-08 DIAGNOSIS — R413 Other amnesia: Secondary | ICD-10-CM | POA: Diagnosis not present

## 2022-06-08 DIAGNOSIS — M545 Low back pain, unspecified: Secondary | ICD-10-CM | POA: Diagnosis not present

## 2022-06-08 DIAGNOSIS — Z0000 Encounter for general adult medical examination without abnormal findings: Secondary | ICD-10-CM | POA: Diagnosis not present

## 2022-06-08 DIAGNOSIS — I10 Essential (primary) hypertension: Secondary | ICD-10-CM | POA: Diagnosis not present

## 2022-06-21 ENCOUNTER — Ambulatory Visit (HOSPITAL_BASED_OUTPATIENT_CLINIC_OR_DEPARTMENT_OTHER)
Admission: RE | Admit: 2022-06-21 | Discharge: 2022-06-21 | Disposition: A | Discharge status: Home / Self Care | Payer: BC Managed Care – PPO | Source: Ambulatory Visit | Attending: Family Medicine | Admitting: Family Medicine

## 2022-06-21 DIAGNOSIS — Z0000 Encounter for general adult medical examination without abnormal findings: Secondary | ICD-10-CM

## 2022-07-04 DIAGNOSIS — M545 Low back pain, unspecified: Secondary | ICD-10-CM | POA: Diagnosis not present

## 2022-08-01 DIAGNOSIS — M545 Low back pain, unspecified: Secondary | ICD-10-CM | POA: Diagnosis not present

## 2022-08-29 DIAGNOSIS — J383 Other diseases of vocal cords: Secondary | ICD-10-CM | POA: Diagnosis not present

## 2022-08-29 DIAGNOSIS — R498 Other voice and resonance disorders: Secondary | ICD-10-CM | POA: Diagnosis not present

## 2022-08-29 DIAGNOSIS — R490 Dysphonia: Secondary | ICD-10-CM | POA: Diagnosis not present

## 2022-09-15 DIAGNOSIS — M545 Low back pain, unspecified: Secondary | ICD-10-CM | POA: Diagnosis not present

## 2022-10-04 DIAGNOSIS — M545 Low back pain, unspecified: Secondary | ICD-10-CM | POA: Diagnosis not present

## 2022-12-21 DIAGNOSIS — R490 Dysphonia: Secondary | ICD-10-CM | POA: Diagnosis not present

## 2022-12-21 DIAGNOSIS — J383 Other diseases of vocal cords: Secondary | ICD-10-CM | POA: Diagnosis not present

## 2022-12-21 DIAGNOSIS — R498 Other voice and resonance disorders: Secondary | ICD-10-CM | POA: Diagnosis not present

## 2023-01-04 DIAGNOSIS — R498 Other voice and resonance disorders: Secondary | ICD-10-CM | POA: Diagnosis not present

## 2023-01-04 DIAGNOSIS — J383 Other diseases of vocal cords: Secondary | ICD-10-CM | POA: Diagnosis not present

## 2023-01-04 DIAGNOSIS — R490 Dysphonia: Secondary | ICD-10-CM | POA: Diagnosis not present

## 2023-01-18 DIAGNOSIS — J383 Other diseases of vocal cords: Secondary | ICD-10-CM | POA: Diagnosis not present

## 2023-01-18 DIAGNOSIS — R498 Other voice and resonance disorders: Secondary | ICD-10-CM | POA: Diagnosis not present

## 2023-01-18 DIAGNOSIS — R490 Dysphonia: Secondary | ICD-10-CM | POA: Diagnosis not present

## 2023-02-09 DIAGNOSIS — H9313 Tinnitus, bilateral: Secondary | ICD-10-CM | POA: Diagnosis not present

## 2023-02-16 DIAGNOSIS — M7062 Trochanteric bursitis, left hip: Secondary | ICD-10-CM | POA: Diagnosis not present

## 2023-02-16 DIAGNOSIS — M25552 Pain in left hip: Secondary | ICD-10-CM | POA: Diagnosis not present

## 2023-06-14 DIAGNOSIS — I10 Essential (primary) hypertension: Secondary | ICD-10-CM | POA: Diagnosis not present

## 2023-06-14 DIAGNOSIS — Z0000 Encounter for general adult medical examination without abnormal findings: Secondary | ICD-10-CM | POA: Diagnosis not present

## 2023-06-14 DIAGNOSIS — E559 Vitamin D deficiency, unspecified: Secondary | ICD-10-CM | POA: Diagnosis not present

## 2023-06-14 DIAGNOSIS — E7800 Pure hypercholesterolemia, unspecified: Secondary | ICD-10-CM | POA: Diagnosis not present
